# Patient Record
Sex: Female | Born: 1975 | Race: Black or African American | Hispanic: No | Marital: Married | Smoking: Current every day smoker
Health system: Southern US, Community
[De-identification: ages and names within clinical notes are randomized; demographics above are authoritative.]

## PROBLEM LIST (undated history)

## (undated) DIAGNOSIS — N6320 Unspecified lump in the left breast, unspecified quadrant: Secondary | ICD-10-CM

## (undated) DIAGNOSIS — I10 Essential (primary) hypertension: Secondary | ICD-10-CM

## (undated) DIAGNOSIS — E669 Obesity, unspecified: Secondary | ICD-10-CM

## (undated) DIAGNOSIS — R600 Localized edema: Secondary | ICD-10-CM

## (undated) DIAGNOSIS — O903 Peripartum cardiomyopathy: Secondary | ICD-10-CM

## (undated) HISTORY — DX: Obesity, unspecified: E66.9

## (undated) HISTORY — DX: Essential (primary) hypertension: I10

## (undated) HISTORY — DX: Peripartum cardiomyopathy: O90.3

## (undated) HISTORY — DX: Unspecified lump in the left breast, unspecified quadrant: N63.20

## (undated) HISTORY — PX: TUBAL LIGATION: SHX77

## (undated) HISTORY — DX: Localized edema: R60.0

---

## 2002-04-02 HISTORY — PX: BACK SURGERY: SHX140

## 2006-04-16 ENCOUNTER — Ambulatory Visit (HOSPITAL_COMMUNITY): Admission: RE | Admit: 2006-04-16 | Discharge: 2006-04-16 | Payer: Self-pay | Admitting: Obstetrics and Gynecology

## 2006-04-23 ENCOUNTER — Ambulatory Visit (HOSPITAL_COMMUNITY): Admission: RE | Admit: 2006-04-23 | Discharge: 2006-04-23 | Payer: Self-pay | Admitting: Obstetrics and Gynecology

## 2006-04-25 ENCOUNTER — Encounter (INDEPENDENT_AMBULATORY_CARE_PROVIDER_SITE_OTHER): Payer: Self-pay | Admitting: *Deleted

## 2006-04-25 ENCOUNTER — Ambulatory Visit (HOSPITAL_COMMUNITY): Admission: RE | Admit: 2006-04-25 | Discharge: 2006-04-25 | Payer: Self-pay | Admitting: Obstetrics and Gynecology

## 2007-01-08 ENCOUNTER — Ambulatory Visit: Payer: Self-pay | Admitting: Obstetrics & Gynecology

## 2007-01-15 ENCOUNTER — Ambulatory Visit: Payer: Self-pay | Admitting: Obstetrics & Gynecology

## 2007-01-21 ENCOUNTER — Ambulatory Visit: Payer: Self-pay | Admitting: Obstetrics & Gynecology

## 2007-01-28 ENCOUNTER — Ambulatory Visit: Payer: Self-pay | Admitting: Obstetrics & Gynecology

## 2007-02-04 ENCOUNTER — Ambulatory Visit: Payer: Self-pay | Admitting: Obstetrics and Gynecology

## 2007-02-11 ENCOUNTER — Ambulatory Visit: Payer: Self-pay | Admitting: Obstetrics & Gynecology

## 2007-02-14 ENCOUNTER — Inpatient Hospital Stay (HOSPITAL_COMMUNITY): Admission: RE | Admit: 2007-02-14 | Discharge: 2007-02-17 | Payer: Self-pay | Admitting: Obstetrics and Gynecology

## 2007-02-14 ENCOUNTER — Encounter (INDEPENDENT_AMBULATORY_CARE_PROVIDER_SITE_OTHER): Payer: Self-pay | Admitting: Obstetrics and Gynecology

## 2007-02-18 ENCOUNTER — Encounter (HOSPITAL_COMMUNITY): Admission: RE | Admit: 2007-02-18 | Discharge: 2007-03-19 | Payer: Self-pay | Admitting: Obstetrics and Gynecology

## 2007-03-20 ENCOUNTER — Encounter: Admission: RE | Admit: 2007-03-20 | Discharge: 2007-04-19 | Payer: Self-pay | Admitting: Obstetrics and Gynecology

## 2007-04-20 ENCOUNTER — Encounter: Admission: RE | Admit: 2007-04-20 | Discharge: 2007-05-16 | Payer: Self-pay | Admitting: Obstetrics and Gynecology

## 2008-10-12 IMAGING — US US OB TRANSVAGINAL
1 series · 14 of 23 positions shown · non-contrast
Comparison: none

OBSTETRICAL ULTRASOUND:
 This ultrasound was performed in The [HOSPITAL], and the [facility] report will be stored to [REDACTED] PACS.

[Series 1: us ob transvaginal · 14 of 23 slices shown]
[im 1/23]
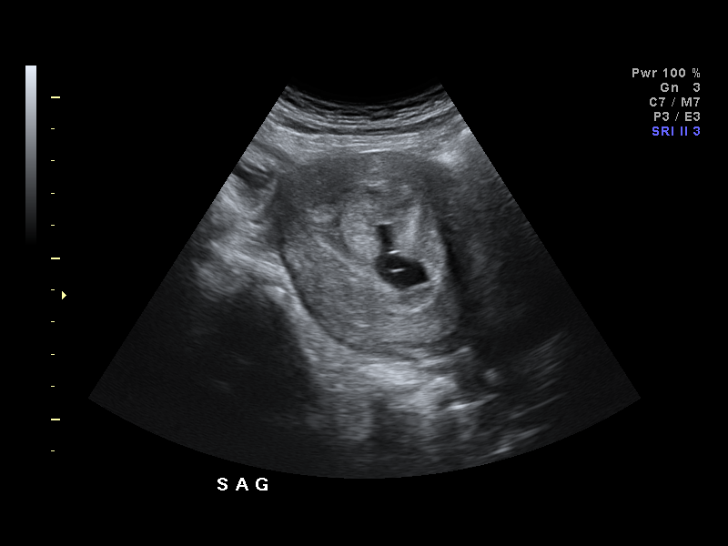
[im 3/23]
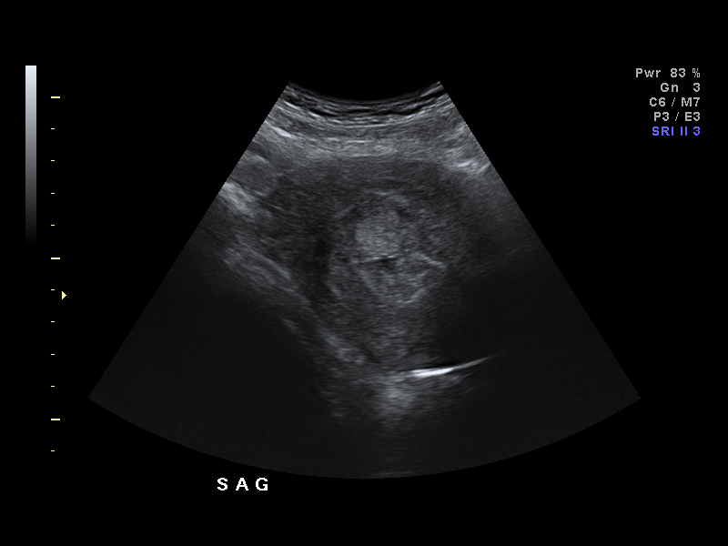
[im 5/23]
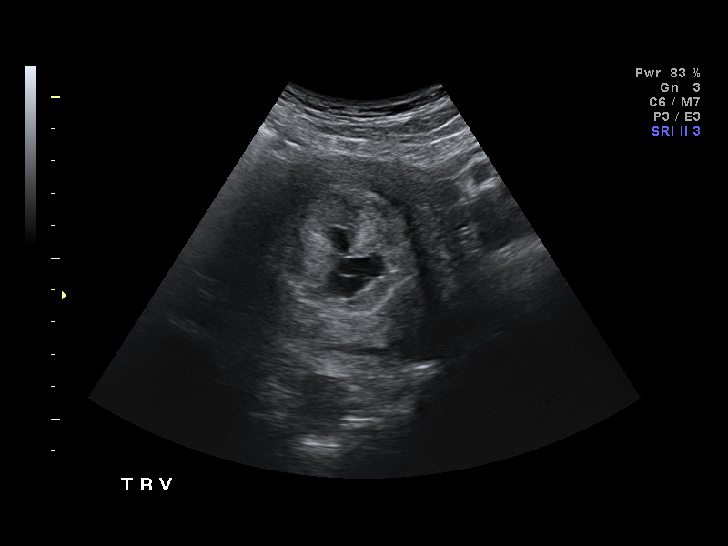
[im 6/23]
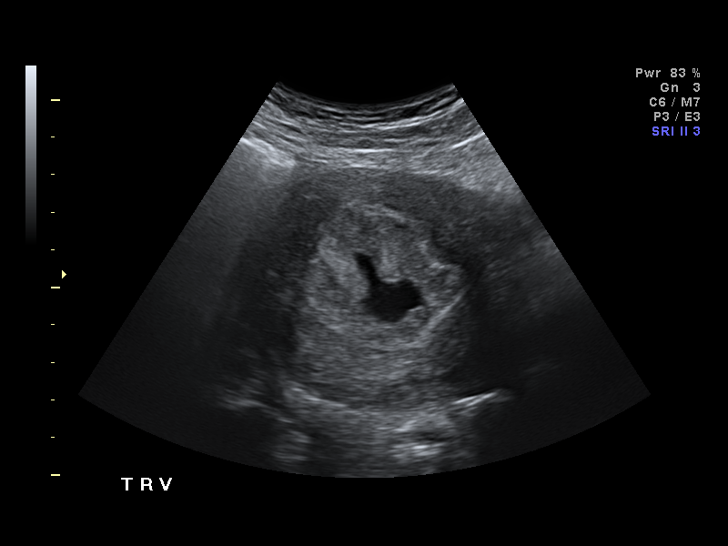
[im 8/23]
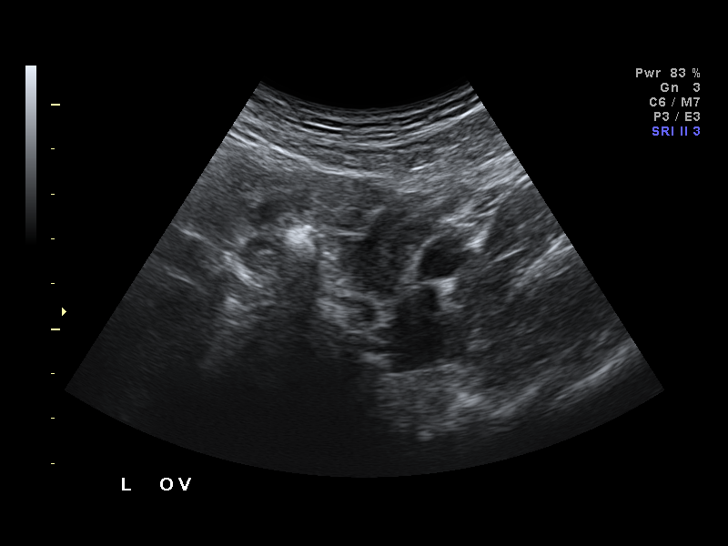
[im 10/23]
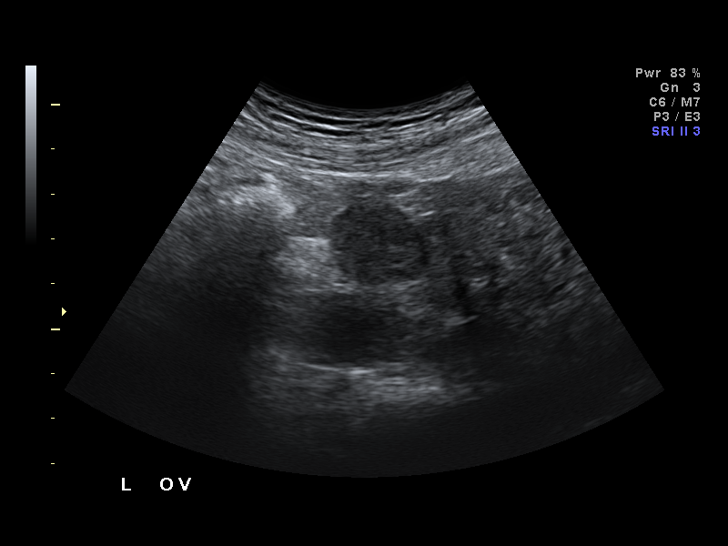
[im 11/23]
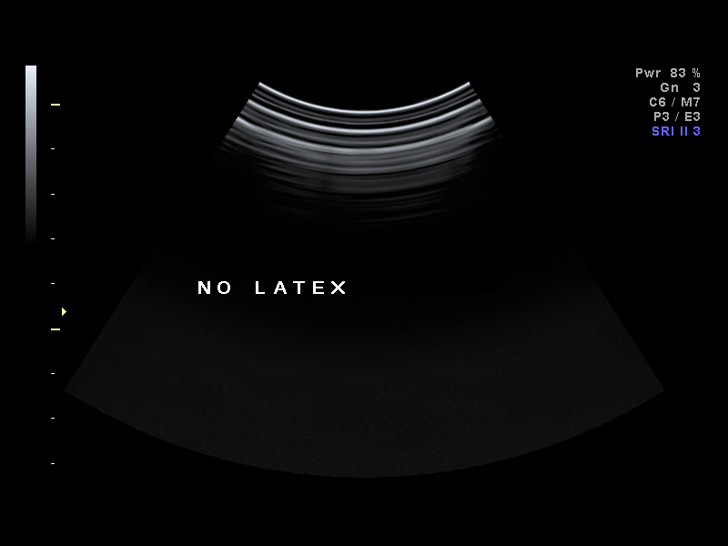
[im 13/23]
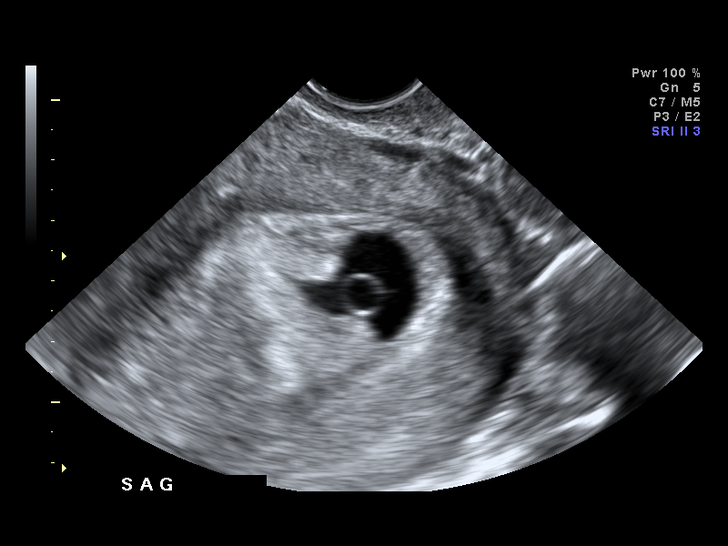
[im 14/23]
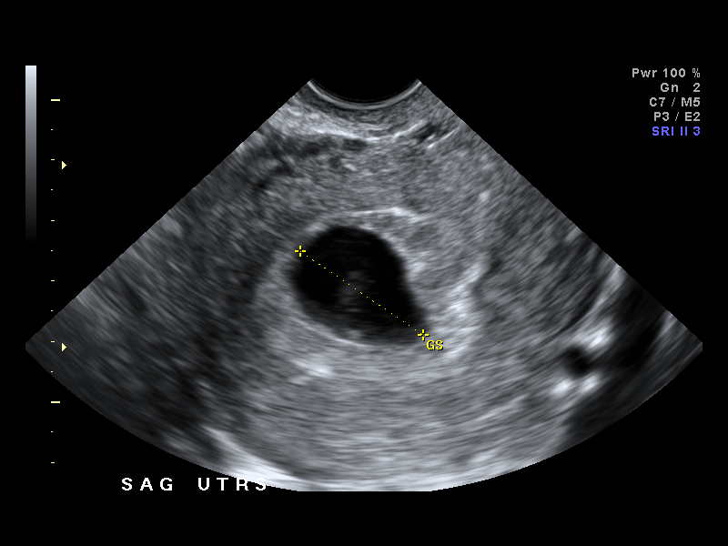
[im 16/23]
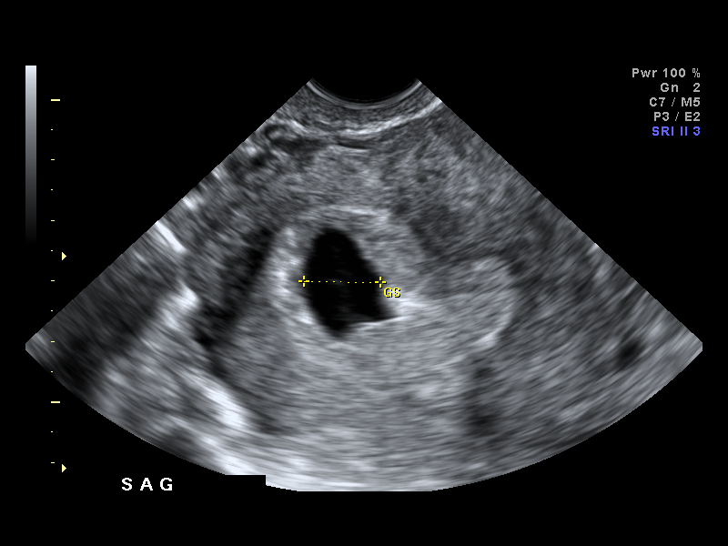
[im 18/23]
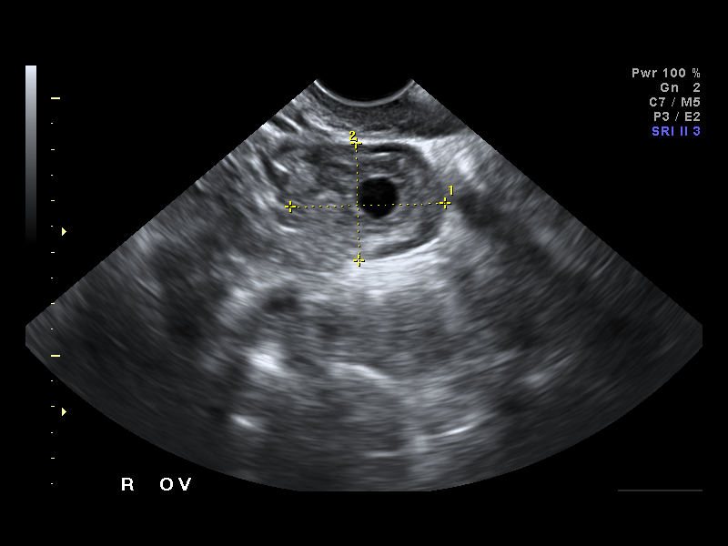
[im 19/23]
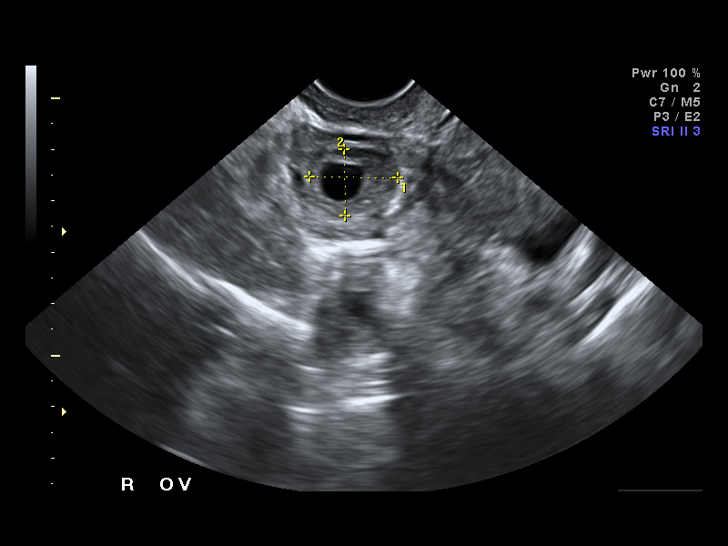
[im 21/23]
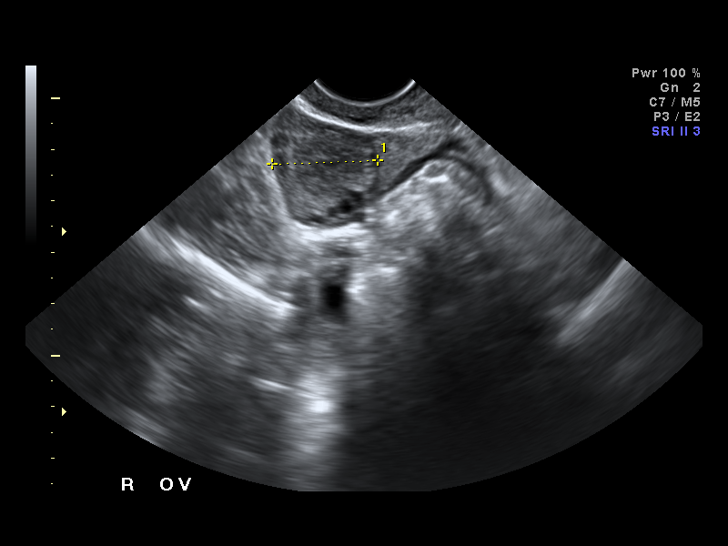
[im 23/23]
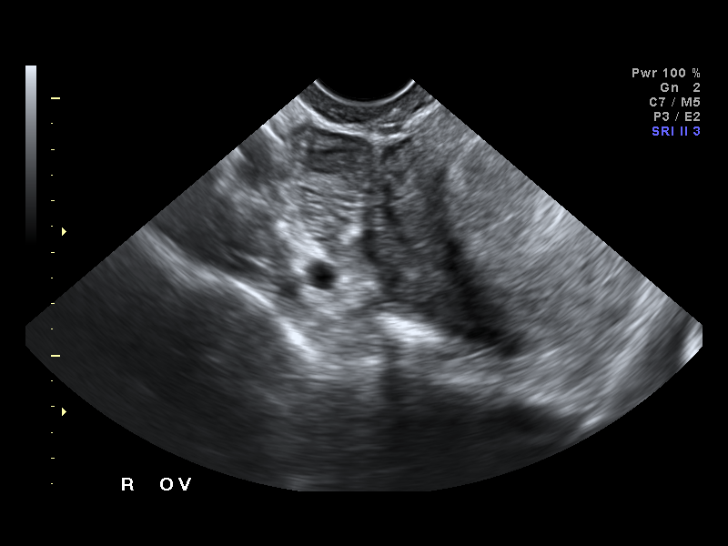

[14 of 23 positions shown; findings below may reference images not displayed]

IMPRESSION: The [facility] report has also been faxed to the ordering physician.

## 2010-08-15 NOTE — Op Note (Signed)
NAME:  Christina Downs, KLEVEN NO.:  [id number]   MEDICAL RECORD NO.:  [id number]          PATIENT TYPE:  INP   LOCATION:                                FACILITY:  [facility]   PHYSICIAN:  Sheronette Cousins, M.D.DATE OF BIRTH:  06-23-1975   DATE OF PROCEDURE:  02/14/2007  DATE OF DISCHARGE:                               OPERATIVE REPORT   PREOPERATIVE DIAGNOSIS:  Intrauterine twin gestation at 56 weeks,  malpresentation second twin, desires sterilization.   PROCEDURE:  Primary cesarean section, Kerr hysterotomy, modified Pomeroy  tubal ligation, removal of left paratubal cyst.   POSTOPERATIVE DIAGNOSIS:  Twin gestation at 73 weeks, malpresentation  twin B, desires sterilization, left paratubal cyst.   ANESTHESIA:  Spinal.   SURGEON:  Maxie Better, M.D.   ASSISTANT:  None.   DESCRIPTION OF PROCEDURE:  Under adequate spinal anesthesia, the patient  was placed in a supine position with a left lateral tilt.  She was  sterilely prepped and draped in the usual fashion.  An indwelling Foley  catheter was sterilely placed.  0.25% Marcaine was injected along the  planned Pfannenstiel skin incision site.  A Pfannenstiel skin incision  was then made and carried down to the rectus fascia. The rectus fascia  was then opened transversely.  The rectus fascia was then bluntly and  sharply dissected off the rectus muscle in a superior and inferior  fashion.  The rectus muscles were split in the midline.  The parietal  peritoneum was entered sharply and extended.  The vesicouterine  peritoneum was opened transversely.  The bladder was displaced  inferiorly after blunt dissection.   A curvilinear low transverse uterine incision was then made and extended  with bandage scissors.  Artificial rupture of membranes, clear amniotic  fluid noted.  Subsequent delivery of a live female from the vertex  position was accomplished. Bulb suctioned on the abdomen, the cord was  clamped and cut,  the baby was transferred to the awaiting pediatrician  who assigned Apgars of 8 and 9 at 1 and 5 minutes.  On palpation of the  second twin, the second twin was in an oblique lie with the head on the  maternal right side. The vertex was guided into the a longitudinal  position.  Artificial rupture of membranes was then performed and then  subsequent delivery of the second female was accomplished.  The placenta  from one of the twins was trying to deliver prior to the delivery of the  second twin. Nonetheless, the second twin was delivered, bulb suctioned  on the abdomen, the cord was clamped and cut. The baby was transferred  to the awaiting pediatrician who assigned Apgars of 5 and 9 at 1 and 5  minutes.  Placenta x2 was removed.  The uterine cavity was cleaned of  debris.  The uterine incision had no extension.  The uterine incision  was closed in two layers, the first layer of 0 Monocryl running locked  stitch, the second layer was imbricating 0 Monocryl suture.  Bleeding on  the right angle was hemostased with a figure-of-eight suture.  Attention was then turned to the left fallopian tube which was  identified down to its fimbriated end.  A normal left ovary was noted.  A large pedunculated paratubal cyst was noted, the base was clamped, cut  and free tied with 3-0 Vicryl suture and the paratubal cyst removed.  Attention was then turned to the left fallopian tube.  The mid portion  of the tube was grasped with a Babcock, the underlying mesosalpinx was  opened proximally and distally, placement of 0 chromic sutures x2  proximally and distally were then done, and the intervening segment of  tube was then removed.  The same procedure was performed on the  contralateral side after identifying the fimbriated end of the right  fallopian tube and a normal right ovary.   The abdomen was then irrigated and suctioned of debris.  The parietal  peritoneum was then closed with 2-0 Vicryl.  The  rectus fascia was  closed with 0 Vicryl x2.  The subcutaneous area was irrigated, small  bleeders cauterized, and interrupted 2-0 plain sutures were then placed  followed by staples for the skin.  The specimen was placenta x2, a  portion of right and left fallopian tube, left paratubal cyst, all sent  to pathology.  Intraoperative fluid was 1800 mL. Urine output was 25 mL  concentrated urine.  Estimated blood loss was 800 mL.  Sponge and  instrument counts x2 was correct.  Complication was none.  The weight of  the babies were 5 pounds for twin A, 5 pounds 10 ounces for twin B.  The  patient tolerated the procedure well and was transferred to the recovery  room in stable condition.  She was received intraoperatively Lasix  because of a prior history of postpartum cardiomyopathy and the patient  having marked peripheral edema currently.      Maxie Better, M.D.  Electronically Signed     [place]/[facility]  D:  02/14/2007  T:  02/15/2007  Job:  [id number]

## 2010-08-15 NOTE — Discharge Summary (Signed)
Christina Downs, ANSELMO NO.:  [id number]   MEDICAL RECORD NO.:  [id number]           PATIENT TYPE:   LOCATION:                                 FACILITY:   PHYSICIAN:  Maxie Better, M.D.DATE OF BIRTH:  May 06, 1975   DATE OF ADMISSION:  02/14/2007  DATE OF DISCHARGE:  02/17/2007                               DISCHARGE SUMMARY   ADMISSION DIAGNOSIS:  1. Malpresentation twin B, twin gestation at 63 weeks.  2. Desires permanent sterilization.   DISCHARGE DIAGNOSIS:  1. Malpresentation twin B, twin gestation at 76 weeks delivered.  2. Desires sterilization.  3. Left paratubal cyst.  4. Iron deficiency anemia.   PROCEDURE:  1. Primary cesarean section.  2. Modified Pomeroy tubal ligation.  3. Removal of a left paratubal cyst.   HOSPITAL COURSE:  The patient was admitted to [facility].  She  upper extremity a primary cesarean section, modified Pomeroy tubal  ligation, and left paratubal cyst removal.  The procedure resulted in  delivery of 2 females; one was 5 pounds, the other one was 5 pounds 10  ounces.  Apgar's of 8 and 9, and 5 and 9 respectively.  Normal tubes  were noted bilaterally with a left paratubal cyst present, and normal  ovaries noted.  The patient had an eventful postoperative course.  CBC  on postop day #1 showed a hemoglobin 8.9, hematocrit 25.5, white count  9.1, platelet count of 387,000.  By postop day #3, the patient felt  relatively good.  Incision had no evidence of an infection.  She was  deemed well to be discharged home.   DISPOSITION:  Home.   CONDITION:  Stable.   DISCHARGE MEDICATIONS:  1. Darvocet-N 100 one p.o. q. 4-6 hours p.r.n. pain.  2. Motrin or Advil 3-4 tablets by mouth every 4-6 hours p.r.n. pain.  3. Colace 100 mg p.o. b.i.d.  4. Niferex 150 mg p.o. q. day.  5. Prenatal vitamins 1 p.o. q. day.   Followup appointment at [facility] in 6 weeks.  Discharge  instructions per the postpartum booklet  given.      Maxie Better, M.D.  Electronically Signed     [place]/MED[facility]  D:  03/22/2007  T:  03/24/2007  Job:  [id number]

## 2011-01-09 LAB — CBC
Hemoglobin: 8.9 — ABNORMAL LOW
Hemoglobin: 9.9 — ABNORMAL LOW
MCHC: 34.7
MCV: 93.6
Platelets: 387
RBC: 3.08 — ABNORMAL LOW
RDW: 14.1

## 2011-01-09 LAB — URINALYSIS, DIPSTICK ONLY
Bilirubin Urine: NEGATIVE
Ketones, ur: NEGATIVE
Specific Gravity, Urine: 1.015
Urobilinogen, UA: 0.2

## 2011-07-26 ENCOUNTER — Other Ambulatory Visit: Payer: Self-pay | Admitting: Family Medicine

## 2011-07-26 DIAGNOSIS — N6320 Unspecified lump in the left breast, unspecified quadrant: Secondary | ICD-10-CM

## 2011-07-27 ENCOUNTER — Ambulatory Visit
Admission: RE | Admit: 2011-07-27 | Discharge: 2011-07-27 | Disposition: A | Discharge status: Home / Self Care | Payer: 59 | Source: Ambulatory Visit | Attending: Family Medicine | Admitting: Family Medicine

## 2011-07-27 DIAGNOSIS — N6320 Unspecified lump in the left breast, unspecified quadrant: Secondary | ICD-10-CM

## 2011-07-27 HISTORY — DX: Unspecified lump in the left breast, unspecified quadrant: N63.20

## 2011-09-03 ENCOUNTER — Other Ambulatory Visit (HOSPITAL_COMMUNITY)
Admission: RE | Admit: 2011-09-03 | Discharge: 2011-09-03 | Disposition: A | Discharge status: Home / Self Care | Payer: 59 | Source: Ambulatory Visit | Attending: Family Medicine | Admitting: Family Medicine

## 2011-09-03 ENCOUNTER — Other Ambulatory Visit: Payer: Self-pay | Admitting: Physician Assistant

## 2011-09-03 DIAGNOSIS — Z0000 Encounter for general adult medical examination without abnormal findings: Secondary | ICD-10-CM | POA: Insufficient documentation

## 2012-04-21 ENCOUNTER — Other Ambulatory Visit: Payer: Self-pay | Admitting: Family Medicine

## 2012-04-21 DIAGNOSIS — N6320 Unspecified lump in the left breast, unspecified quadrant: Secondary | ICD-10-CM

## 2012-04-25 ENCOUNTER — Ambulatory Visit
Admission: RE | Admit: 2012-04-25 | Discharge: 2012-04-25 | Disposition: A | Discharge status: Home / Self Care | Payer: 59 | Source: Ambulatory Visit | Attending: Family Medicine | Admitting: Family Medicine

## 2012-04-25 DIAGNOSIS — N6320 Unspecified lump in the left breast, unspecified quadrant: Secondary | ICD-10-CM

## 2012-07-09 ENCOUNTER — Other Ambulatory Visit: Payer: Self-pay | Admitting: Family Medicine

## 2012-07-09 DIAGNOSIS — N6320 Unspecified lump in the left breast, unspecified quadrant: Secondary | ICD-10-CM

## 2012-07-28 ENCOUNTER — Ambulatory Visit
Admission: RE | Admit: 2012-07-28 | Discharge: 2012-07-28 | Disposition: A | Discharge status: Home / Self Care | Payer: 59 | Source: Ambulatory Visit | Attending: Family Medicine | Admitting: Family Medicine

## 2012-07-28 ENCOUNTER — Other Ambulatory Visit: Payer: Self-pay | Admitting: Family Medicine

## 2012-07-28 DIAGNOSIS — N6320 Unspecified lump in the left breast, unspecified quadrant: Secondary | ICD-10-CM

## 2012-07-31 ENCOUNTER — Ambulatory Visit
Admission: RE | Admit: 2012-07-31 | Discharge: 2012-07-31 | Disposition: A | Discharge status: Home / Self Care | Payer: 59 | Source: Ambulatory Visit | Attending: Family Medicine | Admitting: Family Medicine

## 2012-07-31 ENCOUNTER — Other Ambulatory Visit: Payer: Self-pay | Admitting: Family Medicine

## 2012-07-31 DIAGNOSIS — N6320 Unspecified lump in the left breast, unspecified quadrant: Secondary | ICD-10-CM

## 2019-10-11 ENCOUNTER — Other Ambulatory Visit: Payer: Self-pay

## 2019-10-11 ENCOUNTER — Ambulatory Visit (HOSPITAL_COMMUNITY)
Admission: EM | Admit: 2019-10-11 | Discharge: 2019-10-11 | Disposition: A | Discharge status: Home / Self Care | Payer: BC Managed Care – PPO | Attending: Family Medicine | Admitting: Family Medicine

## 2019-10-11 ENCOUNTER — Encounter (HOSPITAL_COMMUNITY): Payer: Self-pay

## 2019-10-11 DIAGNOSIS — M5441 Lumbago with sciatica, right side: Secondary | ICD-10-CM

## 2019-10-11 MED ORDER — PREDNISONE 5 MG PO TABS: ORAL_TABLET | ORAL | 0 refills | Status: AC

## 2019-10-11 NOTE — Discharge Instructions (Signed)
Please try tylenol  Please try heat on the lower back  Please do not take ibuprofen with the prednisone  Please follow up if your symptoms fail to improve.

## 2019-10-11 NOTE — ED Triage Notes (Signed)
Pt presents to UC for lower back and leg pain x3 days. Pt states she has a history of chronic lower back pain, pt states she has had surgery to "correct slipped disc". Pt states pain feels similar to before surgery. Pt denies numbness/tingling in BLE, full sensation in BLE. Pt ambulated into treatment space with even steady gait. Pt states she has been treating with ibuprofen, with out relief. Pt denies recent falls or injury.

## 2019-10-11 NOTE — ED Provider Notes (Signed)
[facility]    CSN: [id number] Arrival date & time: 10/11/19  1113      History   Chief Complaint Chief Complaint  Patient presents with  . Back Pain    HPI Christina Downs is a 44 y.o. female.   She is presenting with right leg pain and low back pain.  She has had a history of 2 discectomies previously.  Her symptoms started recently and it feels similar to that.  She has been taking ibuprofen.  No specific inciting event.  HPI  Past Medical History:  Diagnosis Date  . Hypertension   . 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 Lower extremity edema   . Obesity   . Postpartum cardiomyopathy     There are no problems to display for this patient.   Past Surgical History:  Procedure Laterality Date  . BACK SURGERY  2004   HERNIATED DISC  . CESAREAN SECTION  2008   FOR TWINS  . TUBAL LIGATION      OB History   No obstetric history on file.      Home Medications    Prior to Admission medications   Medication Sig Start Date End Date Taking? Authorizing Provider  predniSONE (DELTASONE) 5 MG tablet Take 6 pills for first day, 5 pills second day, 4 pills third day, 3 pills fourth day, 2 pills the fifth day, and 1 pill sixth day. 10/11/19   Myra Rude, MD    Family History Family History  Problem Relation Age of Onset  . Hypertension Father   . Hyperlipidemia Father   . Cervical cancer Maternal Grandmother     Social History Social History   Tobacco Use  . Smoking status: Current Every Day Smoker  Substance Use Topics  . Alcohol use: No  . Drug use: No     Allergies   Patient has no known allergies.   Review of Systems Review of Systems  See HPI  Physical Exam Triage Vital Signs ED Triage Vitals [10/11/19 1154]  Enc Vitals Group     BP 118/63     Pulse Rate 68     Resp 16     Temp 98.2 F (36.8 C)     Temp Source Oral     SpO2 100 %     Weight      Height      Head Circumference      Peak Flow       Pain Score 7     Pain Loc      Pain Edu?      Excl. in GC?    No data found.  Updated Vital Signs BP 118/63 (BP Location: Right Arm)   Pulse 68   Temp 98.2 F (36.8 C) (Oral)   Resp 16   LMP 09/18/2019 (Approximate)   SpO2 100%   Visual Acuity Right Eye Distance:   Left Eye Distance:   Bilateral Distance:    Right Eye Near:   Left Eye Near:    Bilateral Near:     Physical Exam Gen: NAD, alert, cooperative with exam, well-appearing ENT: normal lips, normal nasal mucosa,  Eye: normal EOM,  Neuro: normal tone, normal sensation to touch Psych:  normal insight, alert and oriented MSK:  Normal range of motion.   Strength resistance. Neurovascular intact   UC Treatments / Results  Labs (all labs ordered are listed, but only abnormal results are displayed) Labs Reviewed -  No data to display  EKG   Radiology No results found.  Procedures Procedures (including critical care time)  Medications Ordered in UC Medications - No data to display  Initial Impression / Assessment and Plan / UC Course  I have reviewed the triage vital signs and the nursing notes.  Pertinent labs & imaging results that were available during my care of the patient were reviewed by me and considered in my medical decision making (see chart for details).     Christina Downs is a 44 year old female that is presenting with low back pain with right-sided sciatica.  Symptoms are similar to previous instances.  She has had surgery of lower back to previous times.  No specific inciting event.  Will provide with prednisone.  Counseled on supportive care and need for follow-up.  Final Clinical Impressions(s) / UC Diagnoses   Final diagnoses:  Acute right-sided low back pain with right-sided sciatica     Discharge Instructions     Please try tylenol  Please try heat on the lower back  Please do not take ibuprofen with the prednisone  Please follow up if your symptoms fail to improve.     ED  Prescriptions    Medication Sig Dispense Auth. Provider   predniSONE (DELTASONE) 5 MG tablet Take 6 pills for first day, 5 pills second day, 4 pills third day, 3 pills fourth day, 2 pills the fifth day, and 1 pill sixth day. 21 tablet Myra Rude, MD     PDMP not reviewed this encounter.   Myra Rude, MD 10/11/19 [phone]

## 2019-10-19 DIAGNOSIS — M5127 Other intervertebral disc displacement, lumbosacral region: Secondary | ICD-10-CM | POA: Diagnosis not present

## 2019-10-19 DIAGNOSIS — Z681 Body mass index (BMI) 19 or less, adult: Secondary | ICD-10-CM | POA: Diagnosis not present

## 2019-11-06 ENCOUNTER — Other Ambulatory Visit (HOSPITAL_COMMUNITY): Payer: Self-pay | Admitting: Neurosurgery

## 2019-11-06 DIAGNOSIS — M5127 Other intervertebral disc displacement, lumbosacral region: Secondary | ICD-10-CM

## 2019-11-10 DIAGNOSIS — R634 Abnormal weight loss: Secondary | ICD-10-CM | POA: Diagnosis not present

## 2019-11-10 DIAGNOSIS — R5383 Other fatigue: Secondary | ICD-10-CM | POA: Diagnosis not present

## 2019-11-10 DIAGNOSIS — F411 Generalized anxiety disorder: Secondary | ICD-10-CM | POA: Diagnosis not present

## 2019-11-10 DIAGNOSIS — F332 Major depressive disorder, recurrent severe without psychotic features: Secondary | ICD-10-CM | POA: Diagnosis not present

## 2019-11-11 DIAGNOSIS — Z0000 Encounter for general adult medical examination without abnormal findings: Secondary | ICD-10-CM | POA: Diagnosis not present

## 2019-11-13 DIAGNOSIS — R634 Abnormal weight loss: Secondary | ICD-10-CM | POA: Diagnosis not present

## 2019-11-13 DIAGNOSIS — F411 Generalized anxiety disorder: Secondary | ICD-10-CM | POA: Diagnosis not present

## 2019-11-13 DIAGNOSIS — F332 Major depressive disorder, recurrent severe without psychotic features: Secondary | ICD-10-CM | POA: Diagnosis not present

## 2019-11-13 DIAGNOSIS — R5383 Other fatigue: Secondary | ICD-10-CM | POA: Diagnosis not present

## 2019-11-16 ENCOUNTER — Other Ambulatory Visit (HOSPITAL_COMMUNITY): Payer: Self-pay | Admitting: Family Medicine

## 2019-11-16 ENCOUNTER — Other Ambulatory Visit: Payer: Self-pay | Admitting: Family Medicine

## 2019-11-16 DIAGNOSIS — R1010 Upper abdominal pain, unspecified: Secondary | ICD-10-CM

## 2019-11-19 ENCOUNTER — Ambulatory Visit (INDEPENDENT_AMBULATORY_CARE_PROVIDER_SITE_OTHER): Payer: BC Managed Care – PPO | Admitting: Gastroenterology

## 2019-11-19 DIAGNOSIS — D519 Vitamin B12 deficiency anemia, unspecified: Secondary | ICD-10-CM | POA: Diagnosis not present

## 2019-11-26 DIAGNOSIS — D519 Vitamin B12 deficiency anemia, unspecified: Secondary | ICD-10-CM | POA: Diagnosis not present

## 2019-12-04 DIAGNOSIS — D519 Vitamin B12 deficiency anemia, unspecified: Secondary | ICD-10-CM | POA: Diagnosis not present

## 2019-12-11 DIAGNOSIS — R5383 Other fatigue: Secondary | ICD-10-CM | POA: Diagnosis not present

## 2019-12-11 DIAGNOSIS — R634 Abnormal weight loss: Secondary | ICD-10-CM | POA: Diagnosis not present

## 2019-12-11 DIAGNOSIS — F411 Generalized anxiety disorder: Secondary | ICD-10-CM | POA: Diagnosis not present

## 2019-12-11 DIAGNOSIS — F332 Major depressive disorder, recurrent severe without psychotic features: Secondary | ICD-10-CM | POA: Diagnosis not present

## 2020-04-04 DIAGNOSIS — U071 COVID-19: Secondary | ICD-10-CM | POA: Diagnosis not present

## 2020-04-04 DIAGNOSIS — R5383 Other fatigue: Secondary | ICD-10-CM | POA: Diagnosis not present
